# Patient Record
Sex: Male | Born: 1995 | Race: White | Hispanic: No | Marital: Single | Smoking: Never smoker
Health system: Southern US, Community
[De-identification: ages and names within clinical notes are randomized; demographics above are authoritative.]

## PROBLEM LIST (undated history)

## (undated) HISTORY — PX: WISDOM TOOTH EXTRACTION: SHX21

---
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
## 2022-11-06 ENCOUNTER — Encounter: Payer: Self-pay | Admitting: Family Medicine

## 2022-11-06 ENCOUNTER — Ambulatory Visit: Payer: BC Managed Care – PPO | Admitting: Family Medicine

## 2022-11-06 VITALS — BP 138/82 | HR 64 | Temp 97.7°F | Ht 71.0 in | Wt 213.6 lb

## 2022-11-06 DIAGNOSIS — Z6829 Body mass index (BMI) 29.0-29.9, adult: Secondary | ICD-10-CM | POA: Diagnosis not present

## 2022-11-06 DIAGNOSIS — R112 Nausea with vomiting, unspecified: Secondary | ICD-10-CM | POA: Diagnosis not present

## 2022-11-06 DIAGNOSIS — E559 Vitamin D deficiency, unspecified: Secondary | ICD-10-CM

## 2022-11-06 DIAGNOSIS — L659 Nonscarring hair loss, unspecified: Secondary | ICD-10-CM | POA: Diagnosis not present

## 2022-11-06 DIAGNOSIS — R1084 Generalized abdominal pain: Secondary | ICD-10-CM | POA: Diagnosis not present

## 2022-11-06 DIAGNOSIS — M79671 Pain in right foot: Secondary | ICD-10-CM

## 2022-11-06 DIAGNOSIS — R6889 Other general symptoms and signs: Secondary | ICD-10-CM | POA: Diagnosis not present

## 2022-11-06 NOTE — Progress Notes (Signed)
Subjective:  Patient ID: Jonathan Burton, male    DOB: April 01, 1996, 26 y.o.   MRN: [id number]  Patient Care Team: Baruch Gouty, FNP as PCP - General (Family Medicine)   Chief Complaint:  New Patient (Initial Visit) (No previous PCP), Establish Care, Abdominal Pain, and Nausea (X 2 months but has gotten better.  Patient states that now it just happens at random. )   HPI: Jonathan Burton is a 26 y.o. male presenting on 11/06/2022 for New Patient (Initial Visit) (No previous PCP), Establish Care, Abdominal Pain, and Nausea (X 2 months but has gotten better.  Patient states that now it just happens at random. )   Pt presents today to establish care with new PCP, has not seen PCP in several years. He denies any significant medical problems. Family history of oral cancer in mother, lung cancer in maternal grandmother, and diabetes in maternal uncle. He reports daily marijuana use. Social but rare alcohol use. No nicotine use. His biggest concern today is abdominal pain with nausea and vomiting for about 1 month. States this has subsided but was persistent for 1-2 months. Denies changes in marijuana use during these episodes. States he uses the same supplier and has not increased use. 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 No reported injuries. States he has had an increase in hair loss over the last 6 months, mostly in the top of his head. No recent illnesses or stressors.     Relevant past medical, surgical, family, and social history reviewed and updated as indicated.  Allergies and medications reviewed and updated. Data reviewed: Chart in Epic.   History reviewed. No pertinent past medical history.  Past Surgical History:  Procedure Laterality Date   WISDOM TOOTH EXTRACTION      Social History   Socioeconomic History   Marital status: Single    Spouse name: Not on file   Number of children: Not on file   Years  of education: Not on file   Highest education level: Not on file  Occupational History   Not on file  Tobacco Use   Smoking status: Never   Smokeless tobacco: Never  Vaping Use   Vaping Use: Former   Quit date: 03/06/2022  Substance and Sexual Activity   Alcohol use: Yes    Comment: occ   Drug use: Yes    Types: Marijuana   Sexual activity: Yes    Birth control/protection: None  Other Topics Concern   Not on file  Social History Narrative   Not on file   Social Determinants of Health   Financial Resource Strain: Not on file  Food Insecurity: Not on file  Transportation Needs: Not on file  Physical Activity: Not on file  Stress: Not on file  Social Connections: Not on file  Intimate Partner Violence: Not on file    No outpatient encounter medications on file as of 11/06/2022.   No facility-administered encounter medications on file as of 11/06/2022.    Allergies  Allergen Reactions   Penicillins Anaphylaxis    Review of Systems  Constitutional:  Negative for activity change, appetite change, chills, diaphoresis, fatigue, fever and unexpected weight change.  HENT: Negative.    Eyes: Negative.  Negative for photophobia and visual disturbance.  Respiratory:  Negative for cough, chest tightness and shortness of breath.   Cardiovascular:  Negative for chest pain, palpitations and leg swelling.  Gastrointestinal:  Positive for abdominal pain, nausea and vomiting. Negative for abdominal distention, anal bleeding, blood in stool, constipation, diarrhea and rectal pain.  Endocrine: Negative.  Negative for cold intolerance, heat intolerance, polydipsia, polyphagia and polyuria.  Genitourinary:  Negative for decreased urine volume, difficulty urinating, dysuria, frequency and urgency.  Musculoskeletal:  Positive for arthralgias and myalgias. Negative for back pain, gait problem, joint swelling, neck pain and neck stiffness.  Skin:        Hair loss  Allergic/Immunologic:  Negative.   Neurological:  Negative for dizziness, tremors, seizures, syncope, facial asymmetry, speech difficulty, weakness, light-headedness, numbness and headaches.  Hematological: Negative.   Psychiatric/Behavioral:  Negative for agitation, behavioral problems, confusion, decreased concentration, dysphoric mood, hallucinations, self-injury, sleep disturbance and suicidal ideas. The patient is not nervous/anxious and is not hyperactive.   All other systems reviewed and are negative.       Objective:  BP 138/82   Pulse 64   Temp 97.7 F (36.5 C) (Temporal)   Ht _0  (1.803 m)   Wt 213 lb 9.6 oz (96.9 kg)   SpO2 97%   BMI 29.79 kg/m    Wt Readings from Last 3 Encounters:  11/06/22 213 lb 9.6 oz (96.9 kg)    Physical Exam Vitals and nursing note reviewed.  Constitutional:      General: He is not in acute distress.    Appearance: Normal appearance. He is well-developed and well-groomed. He is obese. He is not ill-appearing, toxic-appearing or diaphoretic.  HENT:     Head: Normocephalic and atraumatic.     Jaw: There is normal jaw occlusion.     Right Ear: Hearing normal.     Left Ear: Hearing normal.     Nose: Nose normal.     Mouth/Throat:     Lips: Pink.     Mouth: Mucous membranes are moist.     Pharynx: Oropharynx is clear. Uvula midline.  Eyes:     General: Lids are normal.     Extraocular Movements: Extraocular movements intact.     Conjunctiva/sclera: Conjunctivae normal.     Pupils: Pupils are equal, round, and reactive to light.  Neck:     Thyroid: No thyroid mass, thyromegaly or thyroid tenderness.     Vascular: No carotid bruit or JVD.     Trachea: Trachea and phonation normal.  Cardiovascular:     Rate and Rhythm: Normal rate and regular rhythm.     Chest Wall: PMI is not displaced.     Pulses: Normal pulses.     Heart sounds: Normal heart sounds. No murmur heard.    No friction rub. No gallop.  Pulmonary:     Effort: Pulmonary effort is normal. No  respiratory distress.     Breath sounds: Normal breath sounds. No wheezing.  Abdominal:     General: Bowel sounds are normal. There is no distension or abdominal bruit.     Palpations: Abdomen is soft. There is no hepatomegaly or splenomegaly.     Tenderness: There is no abdominal tenderness. There is no right CVA tenderness or left CVA tenderness.     Hernia: No hernia is present.  Musculoskeletal:     Cervical back: Normal range of motion and neck supple.     Right lower leg: No edema.     Left lower leg: No edema.     Comments: Shoes not removed  Lymphadenopathy:     Cervical: No cervical adenopathy.  Skin:    General: Skin is warm  and dry.     Capillary Refill: Capillary refill takes less than 2 seconds.     Coloration: Skin is not cyanotic, jaundiced or pale.     Findings: No rash.       Neurological:     General: No focal deficit present.     Mental Status: He is alert and oriented to person, place, and time.     Sensory: Sensation is intact.     Motor: Motor function is intact.     Coordination: Coordination is intact.     Gait: Gait is intact.     Deep Tendon Reflexes: Reflexes are normal and symmetric.  Psychiatric:        Attention and Perception: Attention and perception normal.        Mood and Affect: Mood and affect normal.        Speech: Speech normal.        Behavior: Behavior normal. Behavior is cooperative.        Thought Content: Thought content normal.        Cognition and Memory: Cognition and memory normal.        Judgment: Judgment normal.     Results for orders placed or performed during the hospital encounter of 04/05/09  Rapid strep screen  Result Value Ref Range   Streptococcus, Group A Screen (Direct) POSITIVE (A) NEGATIVE       Pertinent labs & imaging results that were available during my care of the patient were reviewed by me and considered in my medical decision making.  Assessment & Plan:  Kiyaan was seen today for new patient (initial  visit), establish care, abdominal pain and nausea.  Diagnoses and all orders for this visit:  Generalized abdominal pain Nausea and vomiting in adult Likely from marijuana use. Discussed this in detail. Symptoms have resolved. Pt aware to report return of symptoms. Labs pending.  -     CMP14+EGFR  BMI 29.0-29.9,adult Diet and exercise encouraged. Labs pending. -     CMP14+EGFR -     Lipid panel -     Anemia Profile B -     VITAMIN D 25 Hydroxy (Vit-D Deficiency, Fractures) -     Hormone Panel  Right foot pain Requested referral to podiatry, referral placed.  -     Ambulatory referral to Podiatry  Hair loss Will check for potential underlying deficiencies which could be contributing to hair loss.  -     Anemia Profile B -     VITAMIN D 25 Hydroxy (Vit-D Deficiency, Fractures) -     Hormone Panel     Continue all other maintenance medications.  Follow up plan: Return in about 3 months (around 02/06/2023), or if symptoms worsen or fail to improve, for CPE.   Continue healthy lifestyle choices, including diet (rich in fruits, vegetables, and lean proteins, and low in salt and simple carbohydrates) and exercise (at least 30 minutes of moderate physical activity daily).  Educational handout given for alopecia  The above assessment and management plan was discussed with the patient. The patient verbalized understanding of and has agreed to the management plan. Patient is aware to call the clinic if they develop any new symptoms or if symptoms persist or worsen. Patient is aware when to return to the clinic for a follow-up visit. Patient educated on when it is appropriate to go to the emergency department.   Monia Pouch, FNP-C [facility] [phone]

## 2022-11-11 MED ORDER — VITAMIN D (ERGOCALCIFEROL) 1.25 MG (50000 UNIT) PO CAPS: 50000.0000 [IU] | ORAL_CAPSULE | ORAL | 3 refills | Status: DC

## 2022-11-11 NOTE — Addendum Note (Signed)
Addended by: Sonny Masters on: 11/11/2022 09:16 AM   Modules accepted: Orders

## 2022-11-18 LAB — HORMONE PANEL (T4,TSH,FSH,TESTT,SHBG,DHEA,ETC)
DHEA-Sulfate, LCMS: 178 ug/dL
Estradiol, Serum, MS: 16 pg/mL
Estrone Sulfate: 47 ng/dL
Follicle Stimulating Hormone: 3 m[IU]/mL
Free T-3: 3.5 pg/mL
Free Testosterone, Serum: 53 pg/mL
Progesterone, Serum: <10 ng/dL
Sex Hormone Binding Globulin: 24.8 nmol/L
T4: 12.4 ug/dL
TSH: 0.89 uU/mL
Testosterone, Serum (Total): 309 ng/dL
Testosterone-% Free: 1.7 %
Triiodothyronine (T-3), Serum: 139 ng/dL

## 2022-11-18 LAB — ANEMIA PROFILE B
Basophils Absolute: 0.1 10*3/uL (ref 0.0–0.2)
Basos: 1 %
EOS (ABSOLUTE): 0.2 10*3/uL (ref 0.0–0.4)
Eos: 2 %
Ferritin: 106 ng/mL (ref 30–400)
Folate: 4.8 ng/mL (ref >3.0)
Hematocrit: 46.2 % (ref 37.5–51.0)
Hemoglobin: 15.2 g/dL (ref 13.0–17.7)
Immature Grans (Abs): 0 10*3/uL (ref 0.0–0.1)
Immature Granulocytes: 0 %
Iron Saturation: 20 % (ref 15–55)
Iron: 63 ug/dL (ref 38–169)
Lymphocytes Absolute: 2.8 10*3/uL (ref 0.7–3.1)
Lymphs: 29 %
MCH: 31.8 pg (ref 26.6–33.0)
MCHC: 32.9 g/dL (ref 31.5–35.7)
MCV: 97 fL (ref 79–97)
Monocytes Absolute: 0.8 10*3/uL (ref 0.1–0.9)
Monocytes: 9 %
Neutrophils Absolute: 5.7 10*3/uL (ref 1.4–7.0)
Neutrophils: 59 %
Platelets: 281 10*3/uL (ref 150–450)
RBC: 4.78 x10E6/uL (ref 4.14–5.80)
RDW: 13.2 % (ref 11.6–15.4)
Retic Ct Pct: 1.3 % (ref 0.6–2.6)
Total Iron Binding Capacity: 319 ug/dL (ref 250–450)
UIBC: 256 ug/dL (ref 111–343)
Vitamin B-12: 532 pg/mL (ref 232–1245)
WBC: 9.7 10*3/uL (ref 3.4–10.8)

## 2022-11-18 LAB — CMP14+EGFR
ALT: 29 IU/L (ref 0–44)
AST: 16 IU/L (ref 0–40)
Albumin/Globulin Ratio: 2.3 — ABNORMAL HIGH (ref 1.2–2.2)
Albumin: 4.9 g/dL (ref 4.3–5.2)
Alkaline Phosphatase: 60 IU/L (ref 44–121)
BUN/Creatinine Ratio: 10 (ref 9–20)
BUN: 10 mg/dL (ref 6–20)
Bilirubin Total: 0.2 mg/dL (ref 0.0–1.2)
CO2: 24 mmol/L (ref 20–29)
Calcium: 9.6 mg/dL (ref 8.7–10.2)
Chloride: 101 mmol/L (ref 96–106)
Creatinine, Ser: 0.98 mg/dL (ref 0.76–1.27)
Globulin, Total: 2.1 g/dL (ref 1.5–4.5)
Glucose: 55 mg/dL — ABNORMAL LOW (ref 70–99)
Potassium: 4.2 mmol/L (ref 3.5–5.2)
Sodium: 140 mmol/L (ref 134–144)
Total Protein: 7 g/dL (ref 6.0–8.5)
eGFR: 108 mL/min/{1.73_m2} (ref >59)

## 2022-11-18 LAB — VITAMIN D 25 HYDROXY (VIT D DEFICIENCY, FRACTURES): Vit D, 25-Hydroxy: 18.1 ng/mL — ABNORMAL LOW (ref 30.0–100.0)

## 2022-11-18 LAB — LIPID PANEL
Chol/HDL Ratio: 5.5 ratio — ABNORMAL HIGH (ref 0.0–5.0)
Cholesterol, Total: 154 mg/dL (ref 100–199)
HDL: 28 mg/dL — ABNORMAL LOW (ref >39)
LDL Chol Calc (NIH): 100 mg/dL — ABNORMAL HIGH (ref 0–99)
Triglycerides: 142 mg/dL (ref 0–149)
VLDL Cholesterol Cal: 26 mg/dL (ref 5–40)

## 2023-02-07 ENCOUNTER — Encounter: Payer: Self-pay | Admitting: Family Medicine

## 2023-02-07 ENCOUNTER — Ambulatory Visit (INDEPENDENT_AMBULATORY_CARE_PROVIDER_SITE_OTHER): Payer: BC Managed Care – PPO | Admitting: Family Medicine

## 2023-02-07 VITALS — BP 128/76 | HR 62 | Temp 97.6°F | Ht 71.0 in | Wt 235.6 lb

## 2023-02-07 DIAGNOSIS — Z13220 Encounter for screening for lipoid disorders: Secondary | ICD-10-CM

## 2023-02-07 DIAGNOSIS — Z1329 Encounter for screening for other suspected endocrine disorder: Secondary | ICD-10-CM

## 2023-02-07 DIAGNOSIS — E559 Vitamin D deficiency, unspecified: Secondary | ICD-10-CM

## 2023-02-07 DIAGNOSIS — Z0000 Encounter for general adult medical examination without abnormal findings: Secondary | ICD-10-CM

## 2023-02-07 DIAGNOSIS — Z114 Encounter for screening for human immunodeficiency virus [HIV]: Secondary | ICD-10-CM

## 2023-02-07 DIAGNOSIS — Z1159 Encounter for screening for other viral diseases: Secondary | ICD-10-CM

## 2023-02-07 DIAGNOSIS — Z0001 Encounter for general adult medical examination with abnormal findings: Secondary | ICD-10-CM

## 2023-02-07 DIAGNOSIS — Z136 Encounter for screening for cardiovascular disorders: Secondary | ICD-10-CM | POA: Diagnosis not present

## 2023-02-07 LAB — CMP14+EGFR
ALT: 27 IU/L (ref 0–44)
Alkaline Phosphatase: 55 IU/L (ref 44–121)
BUN/Creatinine Ratio: 15 (ref 9–20)
Calcium: 9.4 mg/dL (ref 8.7–10.2)
Globulin, Total: 2.2 g/dL (ref 1.5–4.5)
Glucose: 94 mg/dL (ref 70–99)
Potassium: 4.3 mmol/L (ref 3.5–5.2)

## 2023-02-07 LAB — CBC WITH DIFFERENTIAL/PLATELET
Eos: 3 %
Hematocrit: 45.6 % (ref 37.5–51.0)
Immature Grans (Abs): 0 10*3/uL (ref 0.0–0.1)
Lymphocytes Absolute: 2.6 10*3/uL (ref 0.7–3.1)
MCHC: 33.1 g/dL (ref 31.5–35.7)
MCV: 98 fL — ABNORMAL HIGH (ref 79–97)
Monocytes Absolute: 0.6 10*3/uL (ref 0.1–0.9)
Neutrophils Absolute: 4.3 10*3/uL (ref 1.4–7.0)
Neutrophils: 55 %
RBC: 4.65 x10E6/uL (ref 4.14–5.80)
RDW: 13.3 % (ref 11.6–15.4)

## 2023-02-07 LAB — THYROID PANEL WITH TSH

## 2023-02-07 LAB — HIV ANTIBODY (ROUTINE TESTING W REFLEX)

## 2023-02-07 LAB — LIPID PANEL: HDL: 36 mg/dL — ABNORMAL LOW (ref >39)

## 2023-02-07 NOTE — Progress Notes (Signed)
Complete physical exam  Patient: Jonathan Burton   DOB: [birth year]   27 y.o. Male  MRN: [id number]  Subjective:    Chief Complaint  Patient presents with   Annual Exam    Jonathan Burton is a 27 y.o. male who presents today for a complete physical exam. He reports consuming a  poor  diet. The patient does not participate in regular exercise at present. He generally feels fairly well. He reports sleeping well. He does have additional problems to discuss today.    Most recent fall risk assessment:    11/06/2022   11:54 AM  Fall Risk   Falls in the past year? 0     Most recent depression screenings:    02/07/2023   10:26 AM 11/06/2022   11:55 AM  PHQ 2/9 Scores  PHQ - 2 Score 0 0  PHQ- 9 Score 0 0    Vision:Not within last year  and Dental: No current dental problems and Receives regular dental care  Patient Active Problem List   Diagnosis Date Noted   Vitamin D deficiency 02/07/2023   BMI 29.0-29.9,adult 11/06/2022   History reviewed. No pertinent past medical history. Past Surgical History:  Procedure Laterality Date   WISDOM TOOTH EXTRACTION     Social History   Tobacco Use   Smoking status: Never   Smokeless tobacco: Never  Vaping Use   Vaping Use: Former   Quit date: 03/06/2022  Substance Use Topics   Alcohol use: Yes    Comment: occ   Drug use: Yes    Types: Marijuana   Social History   Socioeconomic History   Marital status: Single    Spouse name: Not on file   Number of children: Not on file   Years of education: Not on file   Highest education level: Not on file  Occupational History   Not on file  Tobacco Use   Smoking status: Never   Smokeless tobacco: Never  Vaping Use   Vaping Use: Former   Quit date: 03/06/2022  Substance and Sexual Activity   Alcohol use: Yes    Comment: occ   Drug use: Yes    Types: Marijuana   Sexual activity: Yes    Birth control/protection: None  Other Topics Concern   Not on file  Social History Narrative    Not on file   Social Determinants of Health   Financial Resource Strain: Not on file  Food Insecurity: Not on file  Transportation Needs: Not on file  Physical Activity: Not on file  Stress: Not on file  Social Connections: Not on file  Intimate Partner Violence: Not on file   Family Status  Relation Name Status   Mother  Alive   Father  Alive   Sister  Alive   MGM  Deceased   MGF  Alive   PGM  Deceased   PGF  Deceased   Family History  Problem Relation Age of Onset   Cancer Mother    Epilepsy Father    Lung cancer Maternal Grandmother    Tuberculosis Paternal Grandfather    Allergies  Allergen Reactions   Penicillins Anaphylaxis      Patient Care Team: Baruch Gouty, FNP as PCP - General (Family Medicine)   Outpatient Medications Prior to Visit  Medication Sig   Vitamin D, Ergocalciferol, (DRISDOL) 1.25 MG (50000 UNIT) CAPS capsule Take 1 capsule (50,000 Units total) by mouth every 7 (seven) days. (Patient not taking: Reported on  02/07/2023)   No facility-administered medications prior to visit.    Review of Systems  Constitutional:  Negative for chills, diaphoresis, fever, malaise/fatigue and weight loss.  HENT: Negative.    Eyes:  Negative for blurred vision, double vision, photophobia, pain, discharge and redness.  Respiratory: Negative.    Cardiovascular:  Negative for chest pain, palpitations, orthopnea, claudication, leg swelling and PND.  Gastrointestinal: Negative.   Genitourinary: Negative.   Musculoskeletal: Negative.   Skin:  Negative for itching and rash.       Hair thinning  Neurological: Negative.   Endo/Heme/Allergies: Negative.   Psychiatric/Behavioral: Negative.    All other systems reviewed and are negative.         Objective:     BP 128/76   Pulse 62   Temp 97.6 F (36.4 C) (Temporal)   Ht '5\' 11"'$  (1.803 m)   Wt 235 lb 9.6 oz (106.9 kg)   SpO2 98%   BMI 32.86 kg/m  BP Readings from Last 3 Encounters:  02/07/23 128/76   11/06/22 138/82   Wt Readings from Last 3 Encounters:  02/07/23 235 lb 9.6 oz (106.9 kg)  11/06/22 213 lb 9.6 oz (96.9 kg)      Physical Exam Vitals and nursing note reviewed.  Constitutional:      General: He is not in acute distress.    Appearance: Normal appearance. He is well-developed and well-groomed. He is obese. He is not ill-appearing, toxic-appearing or diaphoretic.  HENT:     Head: Normocephalic and atraumatic.     Jaw: There is normal jaw occlusion.     Right Ear: Hearing, tympanic membrane, ear canal and external ear normal.     Left Ear: Hearing, tympanic membrane, ear canal and external ear normal.     Nose: Nose normal.     Mouth/Throat:     Lips: Pink.     Mouth: Mucous membranes are moist.     Pharynx: Oropharynx is clear. Uvula midline.  Eyes:     General: Lids are normal.     Extraocular Movements: Extraocular movements intact.     Conjunctiva/sclera: Conjunctivae normal.     Pupils: Pupils are equal, round, and reactive to light.  Neck:     Thyroid: No thyroid mass, thyromegaly or thyroid tenderness.     Vascular: No carotid bruit or JVD.     Trachea: Trachea and phonation normal.  Cardiovascular:     Rate and Rhythm: Normal rate and regular rhythm.     Chest Wall: PMI is not displaced.     Pulses: Normal pulses.     Heart sounds: Normal heart sounds. No murmur heard.    No friction rub. No gallop.  Pulmonary:     Effort: Pulmonary effort is normal. No respiratory distress.     Breath sounds: Normal breath sounds. No wheezing.  Abdominal:     General: Bowel sounds are normal. There is no distension or abdominal bruit.     Palpations: Abdomen is soft. There is no hepatomegaly or splenomegaly.     Tenderness: There is no abdominal tenderness. There is no right CVA tenderness or left CVA tenderness.     Hernia: No hernia is present.  Musculoskeletal:        General: Normal range of motion.     Cervical back: Normal range of motion and neck supple.      Right lower leg: No edema.     Left lower leg: No edema.  Lymphadenopathy:     Cervical:  No cervical adenopathy.  Skin:    General: Skin is warm and dry.     Capillary Refill: Capillary refill takes less than 2 seconds.     Coloration: Skin is not cyanotic, jaundiced or pale.     Findings: No rash.  Neurological:     General: No focal deficit present.     Mental Status: He is alert and oriented to person, place, and time.     Sensory: Sensation is intact.     Motor: Motor function is intact.     Coordination: Coordination is intact.     Gait: Gait is intact.     Deep Tendon Reflexes: Reflexes are normal and symmetric.  Psychiatric:        Attention and Perception: Attention and perception normal.        Mood and Affect: Mood and affect normal.        Speech: Speech normal.        Behavior: Behavior normal. Behavior is cooperative.        Thought Content: Thought content normal.        Cognition and Memory: Cognition and memory normal.        Judgment: Judgment normal.      No results found for any visits on 02/07/23. Last CBC Lab Results  Component Value Date   WBC 9.7 11/06/2022   HGB 15.2 11/06/2022   HCT 46.2 11/06/2022   MCV 97 11/06/2022   MCH 31.8 11/06/2022   RDW 13.2 11/06/2022   PLT 281 [id number]   Last metabolic panel Lab Results  Component Value Date   GLUCOSE 55 (L) 11/06/2022   NA 140 11/06/2022   K 4.2 11/06/2022   CL 101 11/06/2022   CO2 24 11/06/2022   BUN 10 11/06/2022   CREATININE 0.98 11/06/2022   EGFR 108 11/06/2022   CALCIUM 9.6 11/06/2022   PROT 7.0 11/06/2022   ALBUMIN 4.9 11/06/2022   LABGLOB 2.1 11/06/2022   AGRATIO 2.3 (H) 11/06/2022   BILITOT 0.2 11/06/2022   ALKPHOS 60 11/06/2022   AST 16 11/06/2022   ALT 29 11/06/2022   Last lipids Lab Results  Component Value Date   CHOL 154 11/06/2022   HDL 28 (L) 11/06/2022   LDLCALC 100 (H) 11/06/2022   TRIG 142 11/06/2022   CHOLHDL 5.5 (H) 11/06/2022   Last vitamin D Lab  Results  Component Value Date   VD25OH 18.1 (L) 11/06/2022   Last vitamin B12 and Folate Lab Results  Component Value Date   VITAMINB12 532 11/06/2022   FOLATE 4.8 11/06/2022        Assessment & Plan:    Routine Health Maintenance and Physical Exam   There is no immunization history on file for this patient.  Health Maintenance  Topic Date Due   DTaP/Tdap/Td (1 - Tdap) Never done   COVID-19 Vaccine (1) 02/23/2023 (Originally 07/14/1996)   INFLUENZA VACCINE  03/16/2023 (Originally 07/16/2022)   Hepatitis C Screening  11/07/2023 (Originally 01/13/2013)   HIV Screening  Completed   HPV VACCINES  Aged Out    Discussed health benefits of physical activity, and encouraged him to engage in regular exercise appropriate for his age and condition.  Problem List Items Addressed This Visit       Other   Vitamin D deficiency   Relevant Orders   VITAMIN D 25 Hydroxy (Vit-D Deficiency, Fractures)   Other Visit Diagnoses     Annual physical exam    -  Primary   Relevant  Orders   CMP14+EGFR   CBC with Differential/Platelet   HIV Antibody (routine testing w rflx)   Hepatitis C antibody   Thyroid Panel With TSH   Lipid panel   VITAMIN D 25 Hydroxy (Vit-D Deficiency, Fractures)   Screening for lipid disorders       Relevant Orders   Lipid panel   Screening for endocrine disorder       Relevant Orders   CMP14+EGFR   Thyroid Panel With TSH   Encounter for screening for HIV       Relevant Orders   HIV Antibody (routine testing w rflx)   Need for hepatitis C screening test       Relevant Orders   Hepatitis C antibody      Return in about 1 year (around 02/08/2024), or if symptoms worsen or fail to improve, for CPE.    The above assessment and management plan was discussed with the patient. The patient verbalized understanding of and has agreed to the management plan. Patient is aware to call the clinic if they develop any new symptoms or if symptoms fail to improve or worsen.  Patient is aware when to return to the clinic for a follow-up visit. Patient educated on when it is appropriate to go to the emergency department.   Monia Pouch, FNP-C [facility] [address] [place], [place] [address] [phone]

## 2023-02-08 LAB — LIPID PANEL
Chol/HDL Ratio: 5 ratio (ref 0.0–5.0)
Cholesterol, Total: 179 mg/dL (ref 100–199)
LDL Chol Calc (NIH): 123 mg/dL — ABNORMAL HIGH (ref 0–99)
Triglycerides: 109 mg/dL (ref 0–149)
VLDL Cholesterol Cal: 20 mg/dL (ref 5–40)

## 2023-02-08 LAB — CMP14+EGFR
AST: 20 IU/L (ref 0–40)
Albumin/Globulin Ratio: 2.2 (ref 1.2–2.2)
Albumin: 4.8 g/dL (ref 4.3–5.2)
BUN: 16 mg/dL (ref 6–20)
Bilirubin Total: 0.4 mg/dL (ref 0.0–1.2)
CO2: 23 mmol/L (ref 20–29)
Chloride: 100 mmol/L (ref 96–106)
Creatinine, Ser: 1.07 mg/dL (ref 0.76–1.27)
Sodium: 144 mmol/L (ref 134–144)
Total Protein: 7 g/dL (ref 6.0–8.5)
eGFR: 97 mL/min/{1.73_m2} (ref >59)

## 2023-02-08 LAB — CBC WITH DIFFERENTIAL/PLATELET
Basophils Absolute: 0.1 10*3/uL (ref 0.0–0.2)
Basos: 1 %
EOS (ABSOLUTE): 0.2 10*3/uL (ref 0.0–0.4)
Hemoglobin: 15.1 g/dL (ref 13.0–17.7)
Immature Granulocytes: 0 %
Lymphs: 34 %
MCH: 32.5 pg (ref 26.6–33.0)
Monocytes: 7 %
Platelets: 219 10*3/uL (ref 150–450)
WBC: 7.7 10*3/uL (ref 3.4–10.8)

## 2023-02-08 LAB — VITAMIN D 25 HYDROXY (VIT D DEFICIENCY, FRACTURES): Vit D, 25-Hydroxy: 13.3 ng/mL — ABNORMAL LOW (ref 30.0–100.0)

## 2023-02-08 LAB — HEPATITIS C ANTIBODY: Hep C Virus Ab: NONREACTIVE

## 2023-02-08 LAB — THYROID PANEL WITH TSH
T3 Uptake Ratio: 28 % (ref 24–39)
T4, Total: 9.2 ug/dL (ref 4.5–12.0)

## 2023-07-21 ENCOUNTER — Other Ambulatory Visit: Payer: Self-pay | Admitting: Family Medicine

## 2023-07-21 DIAGNOSIS — E559 Vitamin D deficiency, unspecified: Secondary | ICD-10-CM

## 2023-07-21 MED ORDER — VITAMIN D (ERGOCALCIFEROL) 1.25 MG (50000 UNIT) PO CAPS: 50000.0000 [IU] | ORAL_CAPSULE | ORAL | 0 refills | Status: AC

## 2023-07-21 NOTE — Telephone Encounter (Signed)
LMOVM refill sent to pharmacy 

## 2023-07-21 NOTE — Telephone Encounter (Signed)
  Prescription Request  07/21/2023  Is this a "Controlled Substance" medicine?   Have you seen your PCP in the last 2 weeks? Made appt for 8/21  If YES, route message to pool  -  If NO, patient needs to be scheduled for appointment.  What is the name of the medication or equipment? Vitamin D, Ergocalciferol, (DRISDOL) 1.25 MG (50000 UNIT) CAPS capsule  Have you contacted your pharmacy to request a refill? Yes    Which pharmacy would you like this sent to? [facility] in [place]    Patient notified that their request is being sent to the clinical staff for review and that they should receive a response within 2 business days.

## 2023-08-06 ENCOUNTER — Other Ambulatory Visit: Payer: BC Managed Care – PPO

## 2023-08-06 ENCOUNTER — Ambulatory Visit: Payer: BC Managed Care – PPO | Admitting: Family Medicine

## 2023-08-06 DIAGNOSIS — E559 Vitamin D deficiency, unspecified: Secondary | ICD-10-CM | POA: Diagnosis not present

## 2023-08-07 LAB — VITAMIN D 25 HYDROXY (VIT D DEFICIENCY, FRACTURES): Vit D, 25-Hydroxy: 25 ng/mL — ABNORMAL LOW (ref 30.0–100.0)
